# Patient Record
Sex: Female | Born: 1965 | Race: White | Hispanic: No | Marital: Single | Smoking: Former smoker
Health system: Southern US, Community
[De-identification: ages and names within clinical notes are randomized; demographics above are authoritative.]

## PROBLEM LIST (undated history)

## (undated) DIAGNOSIS — I10 Essential (primary) hypertension: Secondary | ICD-10-CM

## (undated) HISTORY — PX: PANCREAS SURGERY: SHX731

---

## 2007-04-29 ENCOUNTER — Encounter: Payer: Self-pay | Admitting: Internal Medicine

## 2007-05-24 ENCOUNTER — Encounter: Payer: Self-pay | Admitting: Internal Medicine

## 2008-01-31 ENCOUNTER — Encounter: Payer: Self-pay | Admitting: Internal Medicine

## 2008-03-03 ENCOUNTER — Encounter: Payer: Self-pay | Admitting: Internal Medicine

## 2008-05-01 ENCOUNTER — Encounter: Payer: Self-pay | Admitting: Internal Medicine

## 2008-05-02 ENCOUNTER — Encounter: Payer: Self-pay | Admitting: Internal Medicine

## 2008-08-01 ENCOUNTER — Encounter: Payer: Self-pay | Admitting: Internal Medicine

## 2009-09-12 ENCOUNTER — Encounter: Payer: Self-pay | Admitting: Internal Medicine

## 2009-09-26 ENCOUNTER — Encounter: Payer: Self-pay | Admitting: Internal Medicine

## 2009-09-26 ENCOUNTER — Ambulatory Visit: Payer: Self-pay | Admitting: Family Medicine

## 2009-11-12 ENCOUNTER — Encounter: Payer: Self-pay | Admitting: Internal Medicine

## 2010-01-01 ENCOUNTER — Ambulatory Visit: Payer: Self-pay | Admitting: Family Medicine

## 2010-01-22 ENCOUNTER — Encounter: Payer: Self-pay | Admitting: Internal Medicine

## 2010-02-03 DIAGNOSIS — G479 Sleep disorder, unspecified: Secondary | ICD-10-CM | POA: Insufficient documentation

## 2010-02-03 DIAGNOSIS — F411 Generalized anxiety disorder: Secondary | ICD-10-CM | POA: Insufficient documentation

## 2010-02-03 DIAGNOSIS — K861 Other chronic pancreatitis: Secondary | ICD-10-CM | POA: Insufficient documentation

## 2010-02-03 DIAGNOSIS — Z87891 Personal history of nicotine dependence: Secondary | ICD-10-CM | POA: Insufficient documentation

## 2010-02-03 DIAGNOSIS — I10 Essential (primary) hypertension: Secondary | ICD-10-CM | POA: Insufficient documentation

## 2010-02-07 ENCOUNTER — Encounter: Payer: Self-pay | Admitting: Internal Medicine

## 2010-02-07 ENCOUNTER — Ambulatory Visit
Admission: RE | Admit: 2010-02-07 | Discharge: 2010-02-07 | Payer: Self-pay | Source: Home / Self Care | Attending: Internal Medicine | Admitting: Internal Medicine

## 2010-02-07 ENCOUNTER — Other Ambulatory Visit: Payer: Self-pay | Admitting: Internal Medicine

## 2010-02-07 DIAGNOSIS — R197 Diarrhea, unspecified: Secondary | ICD-10-CM | POA: Insufficient documentation

## 2010-02-07 DIAGNOSIS — R142 Eructation: Secondary | ICD-10-CM

## 2010-02-07 DIAGNOSIS — R109 Unspecified abdominal pain: Secondary | ICD-10-CM | POA: Insufficient documentation

## 2010-02-07 DIAGNOSIS — R143 Flatulence: Secondary | ICD-10-CM

## 2010-02-07 DIAGNOSIS — R110 Nausea: Secondary | ICD-10-CM | POA: Insufficient documentation

## 2010-02-07 DIAGNOSIS — K5900 Constipation, unspecified: Secondary | ICD-10-CM | POA: Insufficient documentation

## 2010-02-07 DIAGNOSIS — K219 Gastro-esophageal reflux disease without esophagitis: Secondary | ICD-10-CM | POA: Insufficient documentation

## 2010-02-07 DIAGNOSIS — R141 Gas pain: Secondary | ICD-10-CM | POA: Insufficient documentation

## 2010-02-07 LAB — COMPREHENSIVE METABOLIC PANEL
ALT: 29 U/L (ref 0–35)
AST: 25 U/L (ref 0–37)
Albumin: 4.4 g/dL (ref 3.5–5.2)
Alkaline Phosphatase: 49 U/L (ref 39–117)
BUN: 7 mg/dL (ref 6–23)
CO2: 26 mEq/L (ref 19–32)
Calcium: 9.5 mg/dL (ref 8.4–10.5)
Chloride: 106 mEq/L (ref 96–112)
Creatinine, Ser: 0.8 mg/dL (ref 0.4–1.2)
GFR: 88.46 mL/min (ref >60.00)
Glucose, Bld: 100 mg/dL — ABNORMAL HIGH (ref 70–99)
Potassium: 4.1 mEq/L (ref 3.5–5.1)
Sodium: 139 mEq/L (ref 135–145)
Total Bilirubin: 1 mg/dL (ref 0.3–1.2)
Total Protein: 7.1 g/dL (ref 6.0–8.3)

## 2010-02-07 LAB — LIPASE: Lipase: 44 U/L (ref 11.0–59.0)

## 2010-02-07 LAB — AMYLASE: Amylase: 57 U/L (ref 27–131)

## 2010-02-11 ENCOUNTER — Ambulatory Visit: Payer: Self-pay | Admitting: Cardiovascular Disease

## 2010-02-20 NOTE — Procedures (Signed)
Summary: Endo Prep  Endo Prep   Imported By: Lester [place] 02/12/2010 09:16:54  _____________________________________________________________________  External Attachment:    Type:   Image     Comment:   External Document

## 2010-02-20 NOTE — Letter (Signed)
Summary: EGD Instructions  [facility]  [address] [place], [place] [address]   Phone: [phone]  Fax: [phone]       Kristen Flores    1965/11/15    MRN: [id number]       Procedure Day Dorna Bloom: Wednesday 02/26/10     Arrival Time: 7:30 am     Procedure Time: 8:30 am     Location of Procedure:                    _x  _ [facility] ( Outpatient Registration)  PREPARATION FOR ENDOSCOPY   On 02/26/10 THE DAY OF THE PROCEDURE:  1.   No solid foods, milk or milk products are allowed after midnight the night before your procedure.  2.   Do not drink anything colored red or purple.  Avoid juices with pulp.  No orange juice.  3.  You may drink clear liquids until 4:30 am, which is 4 hours before your procedure.                                                                                                CLEAR LIQUIDS INCLUDE: Water Jello Ice Popsicles Tea (sugar ok, no milk/cream) Powdered fruit flavored drinks Coffee (sugar ok, no milk/cream) Gatorade Juice: apple, white grape, white cranberry  Lemonade Clear bullion, consomm, broth Carbonated beverages (any kind) Strained chicken noodle soup Hard Candy   MEDICATION INSTRUCTIONS  Unless otherwise instructed, you should take regular prescription medications with a small sip of water as early as possible the morning of your procedure.                    OTHER INSTRUCTIONS  You will need a responsible adult at least 43 years of age to accompany you and drive you home.   This person must remain in the waiting room during your procedure.  Wear loose fitting clothing that is easily removed.  Leave jewelry and other valuables at home.  However, you may wish to bring a book to read or an iPod/MP3 player to listen to music as you wait for your procedure to start.  Remove all body piercing jewelry and leave at home.  Total time from sign-in until discharge is approximately 2-3  hours.  You should go home directly after your procedure and rest.  You can resume normal activities the day after your procedure.  The day of your procedure you should not:   Drive   Make legal decisions   Operate machinery   Drink alcohol   Return to work  You will receive specific instructions about eating, activities and medications before you leave.    The above instructions have been reviewed and explained to me by   _______________________    I fully understand and can verbalize these instructions _____________________________ Date _________

## 2010-02-20 NOTE — Letter (Signed)
Summary: New Patient letter  [facility]  [address] [place], [place] [address]   Phone: [phone]  Fax: [phone]       01/22/2010 MRN: [id number]  [facility] [address] [place], [place]  [address]  Dear Ms. Hudock,  Welcome to the Gastroenterology Division at [facility].    You are scheduled to see Dr.  Juanda Chance on Feb 07, 2010 at 2:45pm on the 3rd floor at [facility], [address]  We ask that you try to arrive at our office 15 minutes prior to your appointment time to allow for check-in.  We would like you to complete the enclosed self-administered evaluation form prior to your visit and bring it with you on the day of your appointment.  We will review it with you.  Also, please bring a complete list of all your medications or, if you prefer, bring the medication bottles and we will list them.  Please bring your insurance card so that we may make a copy of it.  If your insurance requires a referral to see a specialist, please bring your referral form from your primary care physician.  Co-payments are due at the time of your visit and may be paid by cash, check or credit card.     Your office visit will consist of a consult with your physician (includes a physical exam), any laboratory testing he/she may order, scheduling of any necessary diagnostic testing (e.g. x-ray, ultrasound, CT-scan), and scheduling of a procedure (e.g. Endoscopy, Colonoscopy) if required.  Please allow enough time on your schedule to allow for any/all of these possibilities.    If you cannot keep your appointment, please call [phone] to cancel or reschedule prior to your appointment date.  This allows [place] the opportunity to schedule an appointment for another patient in need of care.  If you do not cancel or reschedule by 5 p.m. the business day prior to your appointment date, you will be charged a $50.00 late cancellation/no-show fee.    Thank you for  choosing [facility] for your medical needs.  We appreciate the opportunity to care for you.  Please visit [place] at our website  to learn more about our practice.                     Sincerely,                                                             The Gastroenterology Division
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
## 2010-02-26 ENCOUNTER — Telehealth (INDEPENDENT_AMBULATORY_CARE_PROVIDER_SITE_OTHER): Payer: Self-pay | Admitting: *Deleted

## 2010-02-26 ENCOUNTER — Encounter: Payer: 59 | Admitting: Internal Medicine

## 2010-02-26 ENCOUNTER — Other Ambulatory Visit: Payer: Self-pay | Admitting: Internal Medicine

## 2010-02-26 ENCOUNTER — Ambulatory Visit (HOSPITAL_COMMUNITY)
Admission: RE | Admit: 2010-02-26 | Discharge: 2010-02-26 | Disposition: A | Discharge status: Home / Self Care | Payer: 59 | Source: Ambulatory Visit | Attending: Internal Medicine | Admitting: Internal Medicine

## 2010-02-26 DIAGNOSIS — R109 Unspecified abdominal pain: Secondary | ICD-10-CM | POA: Insufficient documentation

## 2010-02-26 DIAGNOSIS — K228 Other specified diseases of esophagus: Secondary | ICD-10-CM

## 2010-02-26 DIAGNOSIS — R1013 Epigastric pain: Secondary | ICD-10-CM

## 2010-02-27 ENCOUNTER — Telehealth (INDEPENDENT_AMBULATORY_CARE_PROVIDER_SITE_OTHER): Payer: Self-pay | Admitting: *Deleted

## 2010-02-28 ENCOUNTER — Encounter: Payer: Self-pay | Admitting: Internal Medicine

## 2010-03-06 NOTE — Letter (Addendum)
Summary: Patient [facility] Biopsy Results  [facility]  [address] [place], [place] [address]   Phone: [phone]  Fax: [phone]        February 28, 2010 MRN: [id number]    [facility] [address] [place], [place]  [address]    Dear Ms. Ress,  I am pleased to inform you that the biopsies taken during your recent endoscopic examination did not show any evidence of cancer upon pathologic examination.The biopsies show mild inflammation, no  H.pylori  Additional information/recommendations:  _x_No further action is needed at this time.  Please follow-up with      your primary care physician for your other healthcare needs.  __ Please call [phone] to schedule a return visit to review      your condition.  _x_ Continue with the treatment plan as outlined on the day of your      exam.     Please call us if you are having persistent problems or have questions about your condition that have not been fully answered at this time.  Sincerely,  Hart Carwin MD  This letter has been electronically signed by your physician.  Appended Document: Patient Notice-Endo Biopsy Results Letter mailed to patient.

## 2010-03-06 NOTE — Progress Notes (Signed)
  Phone Note Call from Patient Call back at Home Phone [phone]   Caller: Patient Call For: nurse Summary of Call: Patient called and left a message that she will need a prior authorization for her Nexium and her pharmacy will be faxing it. She also wanted to know about the Carafate RX and what it  is for Called patient and let her know we have not received anything from her pharmacy yet. Also discussed Carafate rx. Patient wanted to know how she would receive her results from the bx yesterday. Explained that she would get a letter telling her the results. Initial call taken by: Jesse Fall RN,  February 27, 2010 2:05 PM

## 2010-03-06 NOTE — Miscellaneous (Signed)
Summary: [employer] Auth for Nexium    Case ID: [id number] Member Number: [id number] Case Type: Initial Review Case Start Date: 02/28/2010 Case Status: Coverage has been APPROVED. You will receive a confirmation letter confirming approval of this medication. The patient will also be notified of this approval via an automated outbound phone call or a letter. Please allow approximately 2 hours to update our system with the approval. Once updated, the prescription can be re-submitted.   Coverage Start Date: 02/07/2010 Coverage End Date: 02/28/2011  Patient First Name: Kristen Patient Last Name: Flores DOB: 08-15-1965 Patient Street Address: [address]   Patient City: [place] Patient State: [place] Patient Zip: [phone]  Drug Name & Strength: Nexium 40 Mg

## 2010-03-06 NOTE — Progress Notes (Signed)
  Phone Note Other Incoming   Request: Send information Summary of Call: Records received from Dr. Luster Landsberg with [facility]. 37 pages of records. Forwarded to Dr. Juanda Chance for review.

## 2010-03-12 NOTE — Letter (Signed)
Summary: [name] MD/[facility]  [name] MD/[facility]   Imported By: Lester [place] 03/06/2010 11:18:05  _____________________________________________________________________  External Attachment:    Type:   Image     Comment:   External Document

## 2010-03-12 NOTE — Procedures (Signed)
Summary: EGD  ENDOSCOPY PROCEDURE REPORT  PATIENT:  Kristen Flores, Kristen Flores  MR#:  [id number] BIRTHDATE:   1965/12/30, 44 yrs. old   GENDER:   female  ENDOSCOPIST:   Hedwig Morton. Juanda Chance, MD Referred by:   PROCEDURE DATE:  02/26/2010 PROCEDURE:  EGD with biopsy, 43239 ASA CLASS:   Class II INDICATIONS: abdominal pain upper abd. pain on PPI's, x 6 months, hx of ? pancreatitis 3 yrs ago, CT scan of the abd. is normal, pt on Oxycodone  MEDICATIONS:    Versed 8 mg, Fentanyl 100 mcg, Benadryl 50 mg TOPICAL ANESTHETIC:   Cetacaine Spray  DESCRIPTION OF PROCEDURE:   After the risks benefits and alternatives of the procedure were thoroughly explained, informed consent was obtained.  The Pentax Gastroscope [id number] endoscope was introduced through the mouth and advanced to the second portion of the duodenum, without limitations.  The instrument was slowly withdrawn as the mucosa was fully examined. <<PROCEDUREIMAGES>>          <<OLD IMAGES>>  irregular Z-line in the distal esophagus. Multiple biopsies were obtained and sent to pathology (see image7 and image2). r/o Barrett's  Otherwise the examination was normal (see image3, image4, image5, and image6). Bx's gastric antrum #1, and gastric body #2    Retroflexed views revealed no abnormalities.    The scope was then withdrawn from the patient and the procedure completed.  COMPLICATIONS:   None  ENDOSCOPIC IMPRESSION:  1) Irregular Z-line in the distal esophagus  2) Otherwise normal examination  nothing to account for the abdominal pain, pt was very anxious during the procedure and required large dose of a sedative RECOMMENDATIONS:  1) Await pathology results  stop smoking, try stronger PPI's, add Carafate 1 gm po bid  I think her pain may be functional and she may eventually need a pain management clinic  REPEAT EXAM:   In 0 year(s) for.   _______________________________ Hedwig Morton. Juanda Chance, MD    CC:

## 2010-03-12 NOTE — Letter (Signed)
Summary: Kristen Posner DO/[facility]  Kristen Posner DO/[facility]   Imported By: Lester [place] 03/06/2010 11:27:26  _____________________________________________________________________  External Attachment:    Type:   Image     Comment:   External Document

## 2010-03-12 NOTE — Letter (Signed)
Summary: [facility]   Imported By: Lester [place] 03/06/2010 11:15:19  _____________________________________________________________________  External Attachment:    Type:   Image     Comment:   External Document

## 2010-03-12 NOTE — Letter (Signed)
Summary: [name] MD/[facility]  [name] MD/[facility]   Imported By: Lester [place] 03/06/2010 11:16:51  _____________________________________________________________________  External Attachment:    Type:   Image     Comment:   External Document

## 2010-03-12 NOTE — Letter (Signed)
Summary: [name] MD/[facility]  [name] MD/[facility]   Imported By: Lester [place] 03/06/2010 11:19:18  _____________________________________________________________________  External Attachment:    Type:   Image     Comment:   External Document

## 2010-03-12 NOTE — Letter (Signed)
Summary: [name] MD/[facility]  [name] MD/[facility]   Imported By: Lester [place] 03/06/2010 11:28:48  _____________________________________________________________________  External Attachment:    Type:   Image     Comment:   External Document

## 2010-03-12 NOTE — Letter (Signed)
Summary: [name] MD/[facility]  [name] MD/[facility]   Imported By: Lester [place] 03/06/2010 11:25:25  _____________________________________________________________________  External Attachment:    Type:   Image     Comment:   External Document

## 2011-08-01 IMAGING — CT CT ABD-PELV W/ CM
2 of 5 series · 17 of 46 positions shown, 19 images · IV contrast (Omnipaque 300)
Comparison: None.

CLINICAL DATA: Upper abdominal pain, nausea, bloating,
constipation, diarrhea

CT ABDOMEN AND PELVIS WITH CONTRAST
TECHNIQUE: Multidetector CT imaging of the abdomen and pelvis was
performed following the standard protocol during bolus
administration of intravenous contrast.
Contrast: 100 ml Omni 300

[Series 2: abd/ pel 5mm · axial · 0.65mm/px · z∈[-521,-126]mm · 14 of 89 slices shown, 16 images]
[im 5/89  soft-tissue]
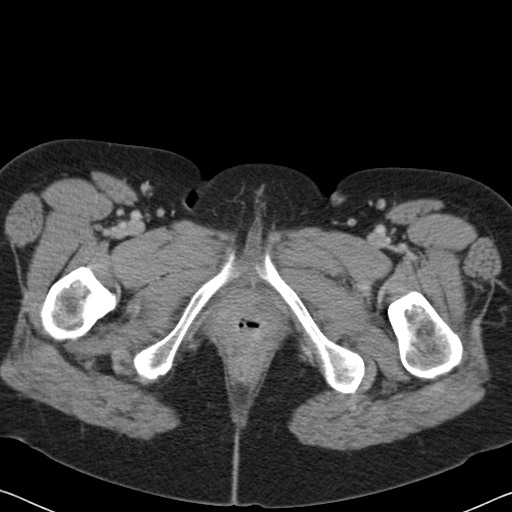
[im 5/89  bone]
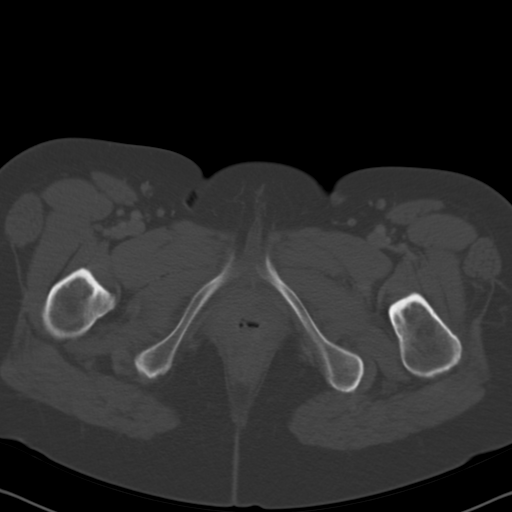
[im 14/89  soft-tissue]
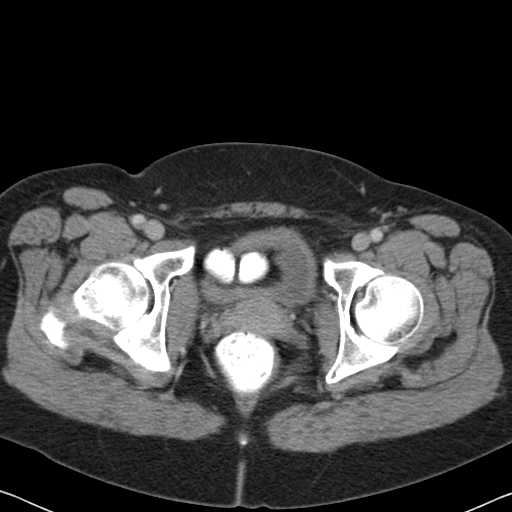
[im 18/89  soft-tissue]
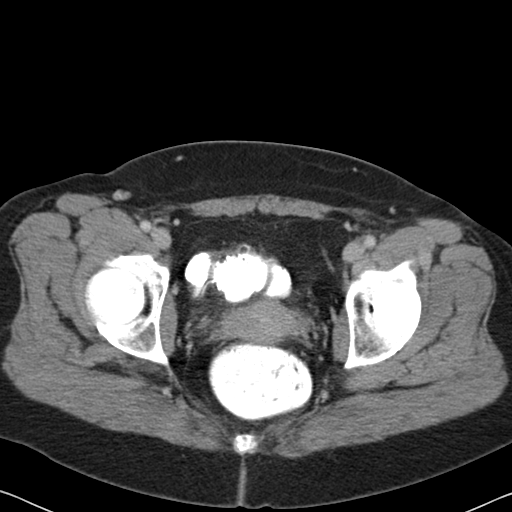
[im 23/89  soft-tissue]
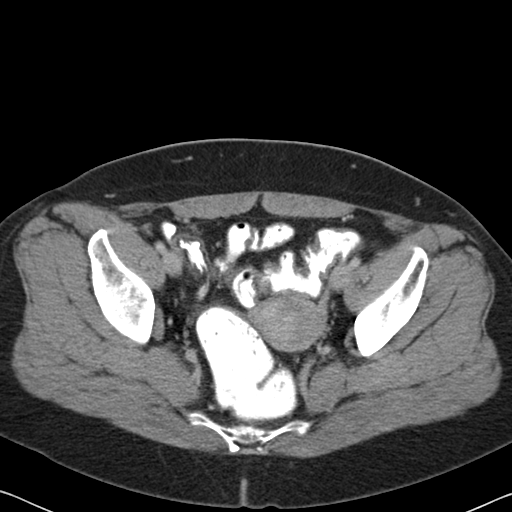
[im 31/89  soft-tissue]
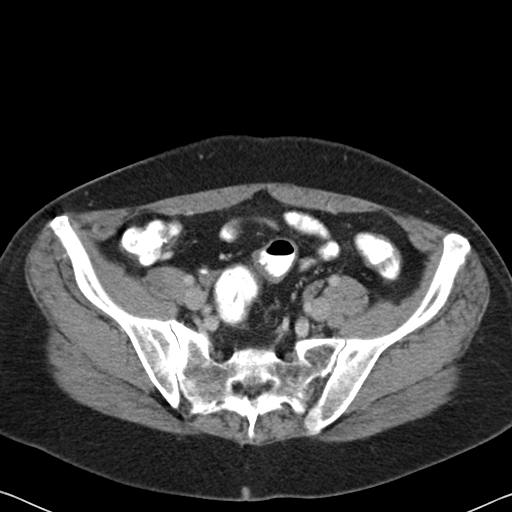
[im 36/89  soft-tissue]
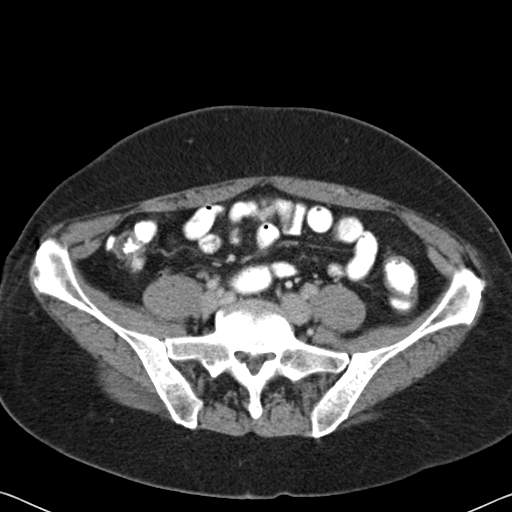
[im 40/89  soft-tissue]
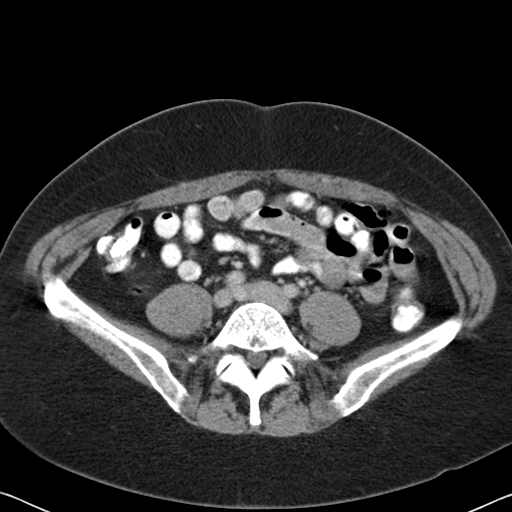
[im 49/89  soft-tissue]
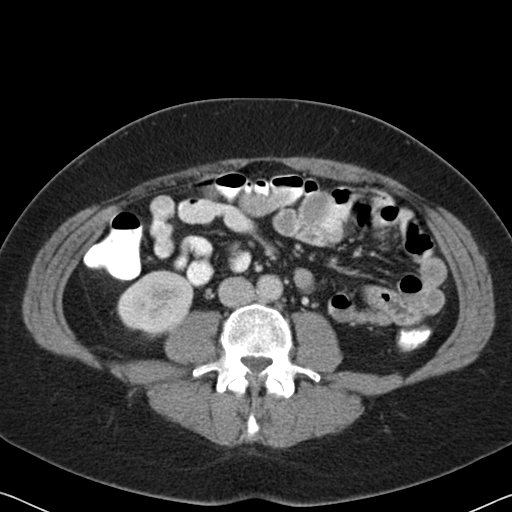
[im 53/89  soft-tissue]
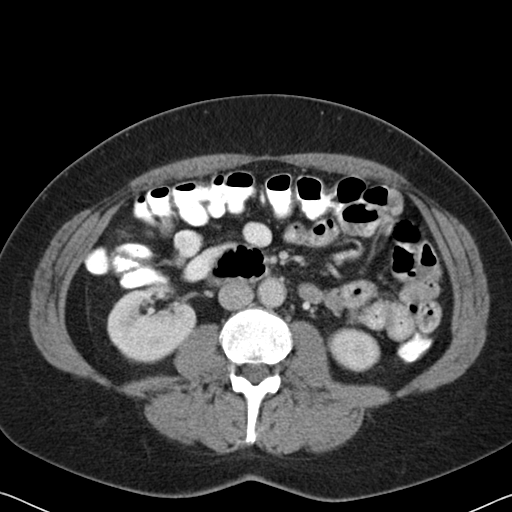
[im 53/89  bone]
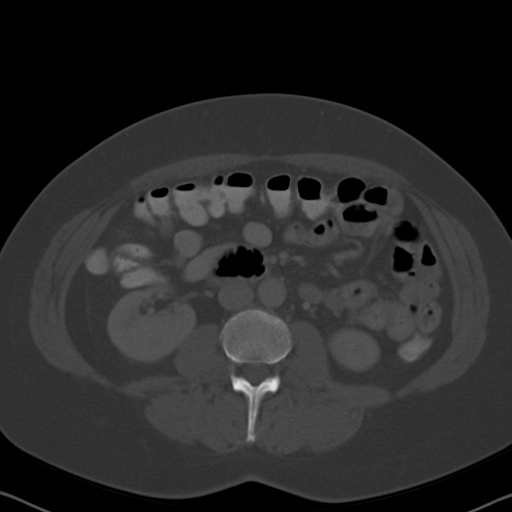
[im 58/89  soft-tissue]
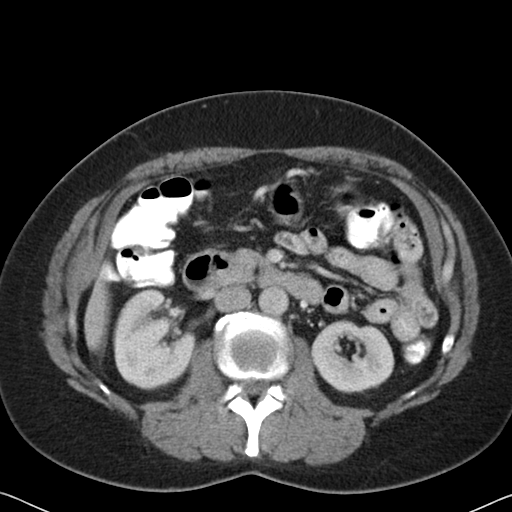
[im 67/89  soft-tissue]
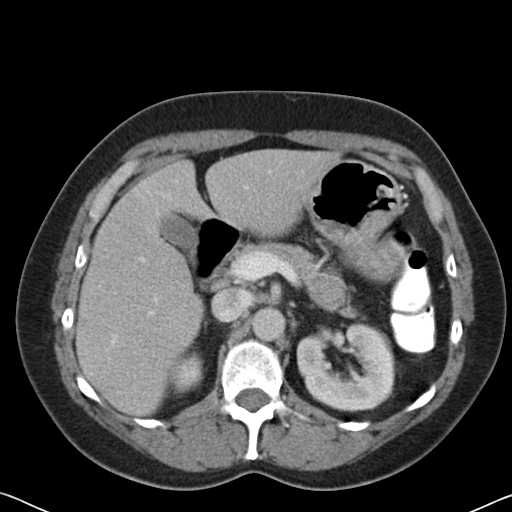
[im 71/89  soft-tissue]
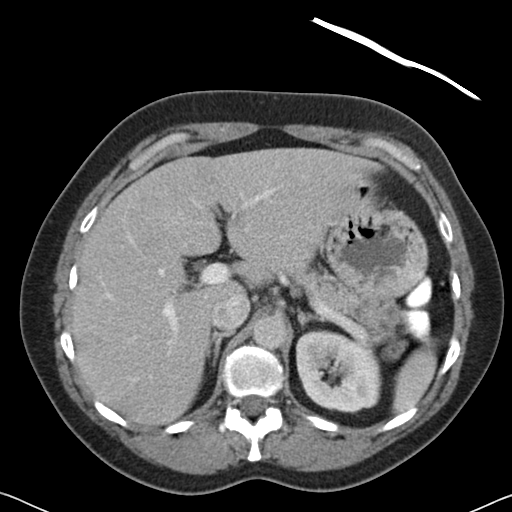
[im 75/89  soft-tissue]
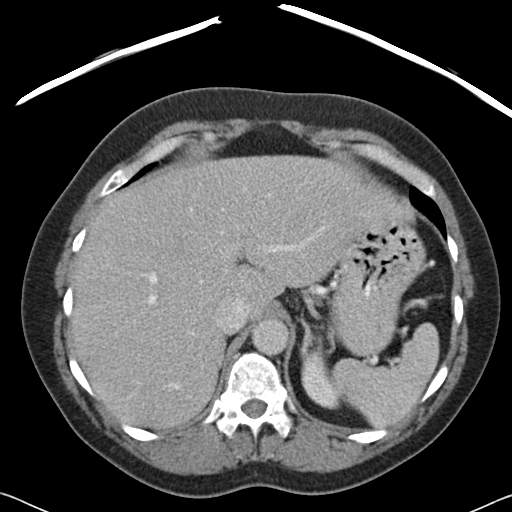
[im 84/89  soft-tissue]
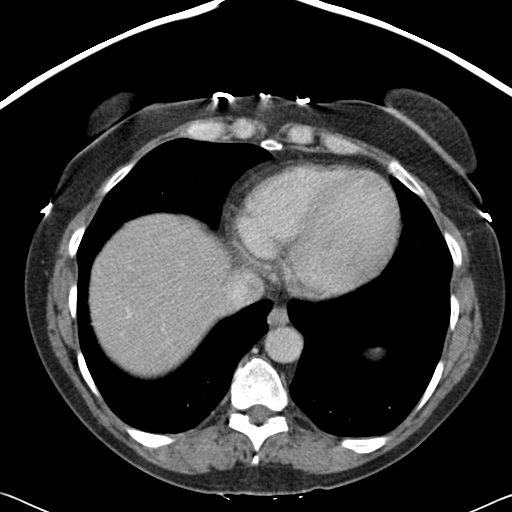

[Series 602: <mpr range> · coronal · 0.89mm/px · 3 of 106 slices shown]
[im 36/106  soft-tissue]
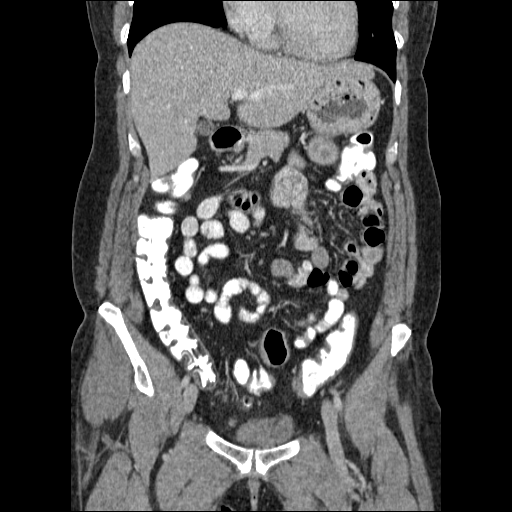
[im 47/106  soft-tissue]
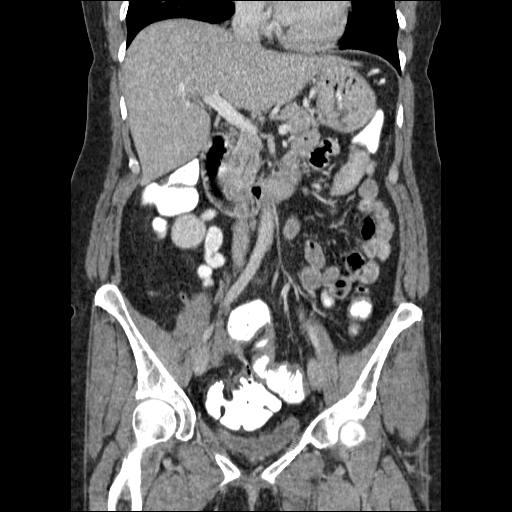
[im 59/106  soft-tissue]
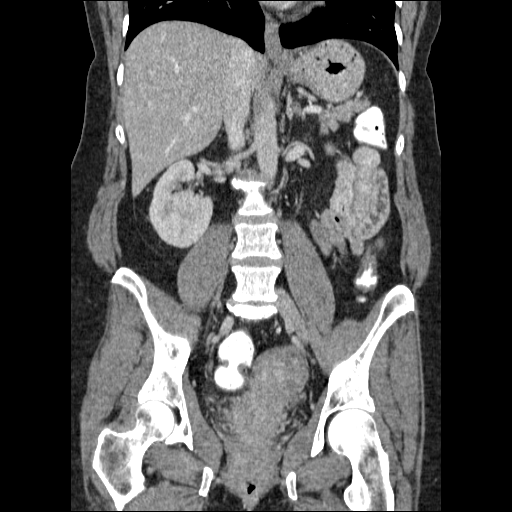

[17 of 46 positions shown; findings below may reference images not displayed]

FINDINGS: The lung bases are clear.  The liver, spleen, pancreas,
adrenal glands, both kidneys have a normal appearance.  The
appendix is well seen in the right lower quadrant and has a normal
appearance.  The osseous structures are unremarkable except for
degenerative changes at the lower lumbar spine.  The bowel has a
normal appearance with no evidence of gross obstruction or
inflammation.  There is no free fluid or adenopathy within the
abdomen or pelvis.  The urinary bladder, uterus, and adnexal
regions are unremarkable.
IMPRESSION: Normal CT scan of the abdomen pelvis.

## 2019-04-20 ENCOUNTER — Ambulatory Visit: Payer: Self-pay | Attending: Internal Medicine

## 2021-02-28 ENCOUNTER — Ambulatory Visit
Admission: EM | Admit: 2021-02-28 | Discharge: 2021-02-28 | Disposition: A | Discharge status: Home / Self Care | Payer: Commercial Managed Care - PPO

## 2021-02-28 ENCOUNTER — Encounter: Payer: Self-pay | Admitting: Emergency Medicine

## 2021-02-28 ENCOUNTER — Other Ambulatory Visit: Payer: Self-pay

## 2021-02-28 DIAGNOSIS — J0110 Acute frontal sinusitis, unspecified: Secondary | ICD-10-CM

## 2021-02-28 DIAGNOSIS — H938X3 Other specified disorders of ear, bilateral: Secondary | ICD-10-CM

## 2021-02-28 DIAGNOSIS — R519 Headache, unspecified: Secondary | ICD-10-CM

## 2021-02-28 HISTORY — DX: Essential (primary) hypertension: I10

## 2021-02-28 MED ORDER — DOXYCYCLINE HYCLATE 100 MG PO CAPS: 100.0000 mg | ORAL_CAPSULE | Freq: Two times a day (BID) | ORAL | 0 refills | Status: AC

## 2021-02-28 NOTE — Discharge Instructions (Addendum)
Recommend start Doxycycline 100mg  twice a day with food as directed. May also take an OTC Probiotic to help with GI side effects from the antibiotic. May continue OTC Dayquil as directed for congestion and headache. Continue to push fluids to help loosen up mucus in sinuses. Follow-up with your PCP in 5 to 6 days if not improving.

## 2021-02-28 NOTE — ED Provider Notes (Signed)
[facility]    CSN: [id number] Arrival date & time: 02/28/21  0809      History   Chief Complaint Chief Complaint  Patient presents with   Headache   Otalgia   Nasal Congestion    HPI Kristen Flores is a 56 y.o. female.   56 year old female presents with nasal congestion, bilateral ear pressure, sore throat, headache, chills and body aches for the past 2-3 weeks. Congestion would improve some but then more sinus pressure would occur and now having more significant ear pain and headaches. Also some nausea and diarrhea. Denies any fever or cough. Has taken 3 home COVID tests which were all negative. Has taken OTC Dayquil and Zicam with no relief. 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 Also has history of Fe deficiency anemia and feels very fatigued. Other chronic health issues include HTN, GERD, and anxiety/sleep disorder. Currently on Norvasc, Atenolol, HCTZ, Lexapro, Prilosec, and Klonopin daily and Atarax and Zofran prn.   The history is provided by the patient.   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      Home Medications    Prior to Admission medications   Medication Sig Start Date End Date Taking? Authorizing Provider  amLODipine (NORVASC) 5 MG tablet Take 5 mg by mouth every morning.  02/10/21  Yes [provider]  atenolol (TENORMIN) 25 MG tablet Take 25 mg by mouth daily. 02/10/21  Yes [provider]  clonazePAM (KLONOPIN) 0.5 MG tablet Take 0.5 mg by mouth 2 (two) times daily. 02/18/21  Yes [provider]  doxycycline (VIBRAMYCIN) 100 MG capsule Take 1 capsule (100 mg total) by mouth 2 (two) times daily for 10 days. 02/28/21 03/10/21 Yes Jowanda Heeg, Ali Lowe, NP  escitalopram (LEXAPRO) 10 MG tablet TAKE 1 AND 1/2 TABLETS DAILY BY MOUTH 12/20/20  Yes [provider]  hydrochlorothiazide (HYDRODIURIL) 12.5 MG tablet Take 1 tablet by mouth daily. 09/06/20  Yes [provider]  hydrOXYzine (ATARAX) 25 MG tablet Take by mouth. 02/17/21 05/18/21 Yes [provider]  levonorgestrel (MIRENA) 20 MCG/DAY IUD by Intrauterine route. 11/05/16  Yes [provider]  Multiple Vitamins-Minerals (THERA-M) TABS Take 1 tablet by mouth every morning. 06/14/20  Yes [provider]  omeprazole (PRILOSEC) 20 MG capsule Take by mouth. 07/29/20 07/29/21 Yes [provider]  ondansetron (ZOFRAN) 4 MG tablet Take by mouth. 03/15/20  Yes [provider]  Pancrelipase, Lip-Prot-Amyl, (CREON) 24000-76000 units CPEP TAKE 2 CAPSULES BY MOUTH WITH MEALS,AND 1 CAPSULE BY MOUTH WITH SNACKS 02/17/21  Yes [provider]    Family History History reviewed. No pertinent family history.  Social History Social History   Tobacco Use   Smoking status: Former    Types: Cigarettes   Smokeless tobacco: Never  Vaping  Use   Vaping Use: Some days  Substance Use Topics   Alcohol use: Not Currently   Drug use: Never     Allergies   Patient has no known allergies.   Review of Systems Review of Systems  Constitutional:  Positive for activity change, appetite change, chills and fatigue. Negative for diaphoresis and fever.  HENT:  Positive for congestion, ear pain (pressure/fullness), sinus pressure, sinus pain and sore throat.  Negative for ear discharge, facial swelling, mouth sores, nosebleeds, postnasal drip, rhinorrhea and trouble swallowing.   Eyes:  Negative for pain, discharge, redness and itching.  Respiratory:  Negative for cough, chest tightness and shortness of breath.   Gastrointestinal:  Positive for diarrhea and nausea. Negative for vomiting.  Musculoskeletal:  Positive for arthralgias and myalgias. Negative for neck pain and neck stiffness.  Skin:  Negative for color change and rash.  Allergic/Immunologic: Negative for environmental allergies and food allergies.  Neurological:  Positive for headaches. Negative for dizziness, tremors, seizures, syncope, speech difficulty and numbness.  Hematological:  Negative for adenopathy.    Physical Exam Triage Vital Signs ED Triage Vitals  Enc Vitals Group     BP 02/28/21 0827 116/78     Pulse Rate 02/28/21 0827 61     Resp 02/28/21 0827 14     Temp 02/28/21 0827 98.6 F (37 C)     Temp Source 02/28/21 0827 Oral     SpO2 02/28/21 0827 98 %     Weight 02/28/21 0822 175 lb (79.4 kg)     Height 02/28/21 0822 5\' 6"  (1.676 m)     Head Circumference --      Peak Flow --      Pain Score 02/28/21 0822 0     Pain Loc --      Pain Edu? --      Excl. in GC? --    No data found.  Updated Vital Signs BP 116/78 (BP Location: Left Arm)    Pulse 61    Temp 98.6 F (37 C) (Oral)    Resp 14    Ht 5\' 6"  (1.676 m)    Wt 175 lb (79.4 kg)    SpO2 98%    BMI 28.25 kg/m   Visual Acuity Right Eye Distance:   Left Eye Distance:   Bilateral Distance:    Right Eye Near:   Left Eye Near:    Bilateral Near:     Physical Exam Vitals reviewed.  Constitutional:      General: She is awake. She is not in acute distress.    Appearance: She is well-developed. She is ill-appearing.     Comments: She is sitting on the exam table in no acute distress but appears tired and ill.   HENT:     Head: Normocephalic and atraumatic.     Right Ear: Hearing, ear canal and external  ear normal. Tympanic membrane is bulging. Tympanic membrane is not injected or erythematous.     Left Ear: Hearing, ear canal and external ear normal. Tympanic membrane is bulging. Tympanic membrane is not injected or erythematous.     Nose: Congestion present. No rhinorrhea.     Right Sinus: Frontal sinus tenderness present. No maxillary sinus tenderness.     Left Sinus: Frontal sinus tenderness present. No maxillary sinus tenderness.     Mouth/Throat:     Lips: Pink.     Mouth: Mucous membranes are moist.     Pharynx: Uvula midline. Oropharyngeal exudate and posterior oropharyngeal  erythema present. No pharyngeal swelling or uvula swelling.     Comments: Yellowish post nasal drainage present Eyes:     Extraocular Movements: Extraocular movements intact.     Conjunctiva/sclera: Conjunctivae normal.  Cardiovascular:     Rate and Rhythm: Normal rate and regular rhythm.     Heart sounds: Normal heart sounds. No murmur heard. Pulmonary:     Effort: Pulmonary effort is normal. No respiratory distress.     Breath sounds: Normal breath sounds and air entry. No decreased air movement. No decreased breath sounds, wheezing, rhonchi or rales.  Musculoskeletal:     Cervical back: Normal range of motion and neck supple.  Lymphadenopathy:     Cervical: No cervical adenopathy.  Skin:    General: Skin is warm and dry.     Capillary Refill: Capillary refill takes less than 2 seconds.     Findings: No rash.  Neurological:     General: No focal deficit present.     Mental Status: She is alert and oriented to person, place, and time.  Psychiatric:        Mood and Affect: Mood normal.        Behavior: Behavior normal. Behavior is cooperative.        Thought Content: Thought content normal.        Judgment: Judgment normal.     UC Treatments / Results  Labs (all labs ordered are listed, but only abnormal results are displayed) Labs Reviewed - No data to display  EKG   Radiology No results  found.  Procedures Procedures (including critical care time)  Medications Ordered in UC Medications - No data to display  Initial Impression / Assessment and Plan / UC Course  I have reviewed the triage vital signs and the nursing notes.  Pertinent labs & imaging results that were available during my care of the patient were reviewed by me and considered in my medical decision making (see chart for details).     Reviewed with patient that she probably has a sinus infection- will treat for bacterial etiology due to over 2 weeks of symptoms and now worsening. Since she is having GI issues currently, especially diarrhea- will avoid Augmentin and trial Doxycycline 100mg  twice a day as directed. Take with food. Encouraged to take OTC Probiotic to help with GI side effects from Doxycycline. May continue OTC Dayquil or similar medication as directed for congestion and headaches. Continue to push fluids to help loosen up mucus in sinuses. Note written for work. Follow-up with her PCP in 5 to 6 days if not improving.  Final Clinical Impressions(s) / UC Diagnoses   Final diagnoses:  Acute non-recurrent frontal sinusitis  Frontal headache  Ear fullness, bilateral     Discharge Instructions      Recommend start Doxycycline 100mg  twice a day with food as directed. May also take an OTC Probiotic to help with GI side effects from the antibiotic. May continue OTC Dayquil as directed for congestion and headache. Continue to push fluids to help loosen up mucus in sinuses. Follow-up with your PCP in 5 to 6 days if not improving.     ED Prescriptions     Medication Sig Dispense Auth. Provider   doxycycline (VIBRAMYCIN) 100 MG capsule Take 1 capsule (100 mg total) by mouth 2 (two) times daily for 10 days. 20 capsule Wilmetta Speiser, , NP      PDMP not reviewed this encounter.   , NP 03/01/21 1030

## 2021-02-28 NOTE — ED Triage Notes (Signed)
Patient c/o HAs, chills, nausea, bilateral ear fullness, and nasal congestion off and on for 2-3 weeks.  Patient denies fevers.  Patient has had 3 negative covid tests.

## 2022-01-22 ENCOUNTER — Ambulatory Visit: Payer: Self-pay

## 2022-01-23 ENCOUNTER — Ambulatory Visit: Payer: Self-pay

## 2022-01-24 ENCOUNTER — Ambulatory Visit
Admission: RE | Admit: 2022-01-24 | Discharge: 2022-01-24 | Disposition: A | Discharge status: Home / Self Care | Payer: Commercial Managed Care - PPO | Source: Ambulatory Visit

## 2022-01-24 VITALS — BP 157/100 | HR 125 | Temp 97.9°F | Resp 16

## 2022-01-24 DIAGNOSIS — J029 Acute pharyngitis, unspecified: Secondary | ICD-10-CM | POA: Diagnosis not present

## 2022-01-24 DIAGNOSIS — R6889 Other general symptoms and signs: Secondary | ICD-10-CM

## 2022-01-24 LAB — POCT RAPID STREP A (OFFICE): Rapid Strep A Screen: NEGATIVE

## 2022-01-24 MED ORDER — LIDOCAINE VISCOUS HCL 2 % MT SOLN: 15.0000 mL | OROMUCOSAL | 0 refills | Status: AC | PRN

## 2022-01-24 NOTE — Discharge Instructions (Signed)
You have been diagnosed with a viral upper respiratory infection based on your symptoms and exam. Viral illnesses cannot be treated with antibiotics - they are self limiting - and you should find your symptoms resolving within a few days. Get plenty of rest and non-caffeinated fluids. Watch for signs of dehydration including reduced urine output and dark colored urine.  Your symptoms are consistent for an influenza or COVID infection, however you are outside the treatment window for antiviral therapy for both.  We recommend you use over-the-counter medications for symptom control including acetaminophen (Tylenol), ibuprofen (Advil/Motrin) or naproxen (Aleve) for fever, chills or body aches. You may combine use of acetaminophen and ibuprofen/naproxen if needed.  I have prescribed viscous lidocaine to help with your acutely painful throat.  I also suggested that you might want to try an over-the-counter throat spray to reduce your symptoms and reliance on NSAID medications.    Also recommend cold/cough medication, generally containing dextromethorphan or other cough suppressant.  Please note that some cough medications are not recommended if you suffer from hypertension.    Saline mist spray is helpful for removing excess mucus from your nose.  Room humidifiers are helpful to ease breathing at night. I recommend guaifenesin (Mucinex) with plenty of water throughout the day to help thin and loosen mucus secretions in your respiratory passages.   If appropriate based upon your other medical problems, you might also find relief of nasal/sinus congestion symptoms by using a nasal decongestant such as Flonase (fluticasone) or Sudafed sinus (pseudoephedrine).  You will need to obtain Sudafed from behind the pharmacist counter.  Speak to the pharmacist to verify that you are not duplicating medications with other over-the-counter formulations that you may be using.

## 2022-01-24 NOTE — ED Provider Notes (Signed)
[facility]    CSN: [id number] Arrival date & time: 01/24/22  1101      History   Chief Complaint Chief Complaint  Patient presents with   Ear Fullness   Sore Throat   Headache    HPI Kristen Flores is a 57 y.o. female.    Ear Fullness Associated symptoms include headaches.  Sore Throat Associated symptoms include headaches.  Headache   Patient presents to urgent care with complaint of headache, sore throat, bilateral ear fullness x 4 days.  She presents with elevated heart rate of 125 bpm and elevated blood pressure of 157/100.  She is treated for hypertension with hydrochlorothiazide and amlodipine and states she did take her medication today.   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      Home Medications    Prior to Admission medications   Medication Sig Start Date End Date Taking? Authorizing Provider  cholestyramine (QUESTRAN) 4 g packet Take by mouth. 11/06/21 11/06/22 Yes [provider]  ibuprofen (ADVIL) 800 MG tablet Take by mouth. 01/01/22  Yes [provider]  ondansetron (ZOFRAN-ODT) 4 MG disintegrating tablet TAKE 1 TABLET BY MOUTH EVERY EIGHT HOURS AS NEEDED FOR NAUSEA. 11/05/21  Yes [provider]  promethazine (PHENERGAN) 25 MG tablet Take by mouth. 11/05/21  Yes [provider]  amLODipine (NORVASC) 5 MG tablet  Take 5 mg by mouth every morning. 02/10/21   [provider]  atenolol (TENORMIN) 25 MG tablet Take 25 mg by mouth daily. 02/10/21   [provider]  clonazePAM (KLONOPIN) 0.5 MG tablet Take 0.5 mg by mouth 2 (two) times daily. 02/18/21   [provider]  escitalopram (LEXAPRO) 10 MG tablet TAKE 1 AND 1/2 TABLETS DAILY BY MOUTH 12/20/20   [provider]  hydrochlorothiazide (HYDRODIURIL) 12.5 MG tablet Take 1 tablet by mouth daily. 09/06/20   [provider]  hydrOXYzine (ATARAX) 25 MG tablet Take 25 mg by mouth daily.    [provider]  levonorgestrel (MIRENA) 20 MCG/DAY IUD by Intrauterine route. 11/05/16   [provider]  Multiple Vitamins-Minerals (THERA-M) TABS Take 1 tablet by mouth every morning. 06/14/20   [provider]  omeprazole (PRILOSEC) 20 MG capsule Take by mouth. 07/29/20 07/29/21  [provider]  ondansetron (ZOFRAN) 4 MG tablet Take by mouth. 03/15/20   [provider]  Pancrelipase, Lip-Prot-Amyl, (CREON) 24000-76000 units CPEP TAKE 2 CAPSULES BY MOUTH WITH MEALS,AND 1 CAPSULE BY MOUTH WITH SNACKS 02/17/21   [provider]    Family History History reviewed. No pertinent family history.  Social History Social History   Tobacco Use   Smoking status: Former    Types: Cigarettes   Smokeless tobacco: Never  Vaping Use   Vaping Use: Some days  Substance Use Topics   Alcohol use: Not Currently   Drug use: Never  Allergies   Patient has no known allergies.   Review of Systems Review of Systems  Neurological:  Positive for headaches.     Physical Exam Triage Vital Signs ED Triage Vitals  Enc Vitals Group     BP 01/24/22 1129 (!) 157/100     Pulse Rate 01/24/22 1129 (!) 125     Resp 01/24/22 1129 16     Temp 01/24/22 1129 97.9 F (36.6 C)     Temp src --      SpO2 01/24/22 1129 96 %     Weight --      Height --      Head Circumference --      Peak Flow --       Pain Score 01/24/22 1130 0     Pain Loc --      Pain Edu? --      Excl. in GC? --    No data found.  Updated Vital Signs BP (!) 157/100   Pulse (!) 125   Temp 97.9 F (36.6 C)   Resp 16   SpO2 96%   Visual Acuity Right Eye Distance:   Left Eye Distance:   Bilateral Distance:    Right Eye Near:   Left Eye Near:    Bilateral Near:     Physical Exam Vitals reviewed.  Constitutional:      General: She is in acute distress.     Appearance: She is well-developed. She is ill-appearing.  HENT:     Right Ear: Tympanic membrane normal.     Left Ear: Tympanic membrane normal.     Mouth/Throat:     Pharynx: Posterior oropharyngeal erythema present. No oropharyngeal exudate.  Cardiovascular:     Rate and Rhythm: Normal rate and regular rhythm.     Heart sounds: Normal heart sounds.  Pulmonary:     Effort: Pulmonary effort is normal.     Breath sounds: Normal breath sounds.  Abdominal:     General: Bowel sounds are normal.     Palpations: Abdomen is soft.  Skin:    General: Skin is warm and dry.  Neurological:     General: No focal deficit present.     Mental Status: She is alert and oriented to person, place, and time.  Psychiatric:        Mood and Affect: Mood normal.        Behavior: Behavior normal.      UC Treatments / Results  Labs (all labs ordered are listed, but only abnormal results are displayed) Labs Reviewed - No data to display  EKG   Radiology No results found.  Procedures Procedures (including critical care time)  Medications Ordered in UC Medications - No data to display  Initial Impression / Assessment and Plan / UC Course  I have reviewed the triage vital signs and the nursing notes.  Pertinent labs & imaging results that were available during my care of the patient were reviewed by me and considered in my medical decision making (see chart for details).   Patient is afebrile here without recent antipyretics. Satting well on room  air with elevated HR. Overall is ill appearing, in some distress (her head is trembling), though well hydrated and without respiratory distress. Pulmonary exam is unremarkable.  Lungs CTAB without wheezing, rhonchi, rales.  Her pharynx is erythematous but without obvious peritonsillar exudates.  Tonsils are not clearly observed due to the prominence of her tongue.  Rapid strep is negative.  Symptoms are  consistent with acute viral process including influenza and COVID.  Duration of symptoms are outside the treatment window for influenza and she is not experiencing any shortness of breath or dyspnea and so would not recommend COVID antiviral given she does not have any comorbidities that would imply increased risk of poor outcome.  Will provide viscous lidocaine for throat pain as an alternative to her constant use of NSAIDs.  Did suggest Chloraseptic throat spray as an alternative pain management solution.  Otherwise recommending continued use of OTC medication for symptom control.  Final Clinical Impressions(s) / UC Diagnoses   Final diagnoses:  None   Discharge Instructions   None    ED Prescriptions   None    PDMP not reviewed this encounter.   Rose Phi, [place] 01/24/22 1229

## 2022-01-24 NOTE — ED Triage Notes (Signed)
Pt. Presents to UC w/ c/o a headache, sore throat and bilateral ear fullness for the past 4 days.

## 2022-03-24 ENCOUNTER — Ambulatory Visit: Payer: Self-pay | Admitting: Internal Medicine

## 2022-06-02 ENCOUNTER — Ambulatory Visit: Payer: Self-pay | Admitting: Family Medicine

## 2022-09-17 ENCOUNTER — Ambulatory Visit: Payer: Self-pay | Admitting: Internal Medicine

## 2022-09-22 ENCOUNTER — Ambulatory Visit: Payer: Self-pay

## 2022-09-24 ENCOUNTER — Ambulatory Visit: Payer: Commercial Managed Care - PPO

## 2023-11-12 ENCOUNTER — Other Ambulatory Visit: Payer: Self-pay | Admitting: Family Medicine

## 2023-11-12 DIAGNOSIS — Z1231 Encounter for screening mammogram for malignant neoplasm of breast: Secondary | ICD-10-CM

## 2023-11-18 ENCOUNTER — Ambulatory Visit
Admission: RE | Admit: 2023-11-18 | Discharge: 2023-11-18 | Disposition: A | Discharge status: Home / Self Care | Source: Ambulatory Visit | Attending: Family Medicine | Admitting: Family Medicine

## 2023-11-18 DIAGNOSIS — Z1231 Encounter for screening mammogram for malignant neoplasm of breast: Secondary | ICD-10-CM
